# Patient Record
Sex: Male | Born: 1975 | Race: White | Hispanic: No | Marital: Married | Smoking: Never smoker
Health system: Southern US, Community
[De-identification: ages and names within clinical notes are randomized; demographics above are authoritative.]

---

## 2014-11-21 ENCOUNTER — Emergency Department (INDEPENDENT_AMBULATORY_CARE_PROVIDER_SITE_OTHER): Payer: Worker's Compensation

## 2014-11-21 ENCOUNTER — Emergency Department (INDEPENDENT_AMBULATORY_CARE_PROVIDER_SITE_OTHER)
Admission: EM | Admit: 2014-11-21 | Discharge: 2014-11-21 | Disposition: A | Discharge status: Home / Self Care | Payer: Worker's Compensation | Source: Home / Self Care | Attending: Family Medicine | Admitting: Family Medicine

## 2014-11-21 DIAGNOSIS — M25571 Pain in right ankle and joints of right foot: Secondary | ICD-10-CM | POA: Diagnosis not present

## 2014-11-21 DIAGNOSIS — S93401A Sprain of unspecified ligament of right ankle, initial encounter: Secondary | ICD-10-CM | POA: Diagnosis not present

## 2014-11-21 MED ORDER — MELOXICAM 7.5 MG PO TABS: 7.5000 mg | ORAL_TABLET | Freq: Every day | ORAL | Status: DC

## 2014-11-21 NOTE — ED Provider Notes (Signed)
CSN: [id number]     Arrival date & time 11/21/14  1303 History   First MD Initiated Contact with Patient 11/21/14 1330     Chief Complaint  Patient presents with  . Ankle Pain   (Consider location/radiation/quality/duration/timing/severity/associated sxs/prior Treatment) HPI  The patient is a 39 year old male presenting to urgent care for further evaluation of a work related incident with [employer].  Pt is c/o of right foot and ankle pain that started suddenly around 5:30-6 AM this morning after patient accidentally externally rotated his right ankle on wet grass while working.  He has continued to work this morning and early afternoon with mild to moderate discomfort but states ankle feels stable. Patient states he did fall but denies hitting his head or loss of consciousness.  Pain is in dorsal lateral aspect of right foot and ankle, 7/10 at worse with certain movements and palpation 1/10 while at rest.  He has not taking anything for pain.  Reports prior strain and sprains to foot, but no broken bones or surgeries.  Denies numbness or tingling in right foot.  Denies any other injuries.  History reviewed. No pertinent past medical history. History reviewed. No pertinent past surgical history. Family History  Problem Relation Age of Onset  . Lymphoma Father    History  Substance Use Topics  . Smoking status: Never Smoker   . Smokeless tobacco: Not on file  . Alcohol Use: 0.6 oz/week    1 Standard drinks or equivalent per week    Review of Systems  Musculoskeletal: Positive for myalgias and arthralgias. Negative for joint swelling.       Right foot and ankle  Skin: Negative for color change and wound.  Neurological: Negative for weakness and numbness.    Allergies  Review of patient's allergies indicates no known allergies.  Home Medications   Prior to Admission medications   Medication Sig Start Date End Date Taking? Authorizing Provider  propranolol (INDERAL) 20 MG tablet Take 20  mg by mouth 2 (two) times daily. Pt stated 1-2 times a day   Yes Historical Provider, MD  meloxicam (MOBIC) 7.5 MG tablet Take 1 tablet (7.5 mg total) by mouth daily. 11/21/14   [name], PA-C   BP 104/68 mmHg  Pulse 65  Temp(Src) 98.4 F (36.9 C) (Oral)  Ht 5\' 10"  (1.778 m)  Wt 166 lb 12 oz (75.637 kg)  BMI 23.93 kg/m2  SpO2 99% Physical Exam  Constitutional: He is oriented to person, place, and time. He appears well-developed and well-nourished.  HENT:  Head: Normocephalic and atraumatic.  Eyes: EOM are normal.  Neck: Normal range of motion.  Cardiovascular: Normal rate.   Pulses:      Dorsalis pedis pulses are 2+ on the right side.  Right foot: cap refill < 3 seconds  Pulmonary/Chest: Effort normal.  Musculoskeletal: Normal range of motion. He exhibits tenderness. He exhibits no edema.  Right ankle and foot: no obvious deformity or edema. Mild tenderness to dorsal lateral aspect foot and ankle. FROM at ankle and toes with increased pain on full dorsiflexion. FROM Right knee w/o pain. Calf is soft, non-tender.  Neurological: He is alert and oriented to person, place, and time.  Right foot: sensation normal. Normal gait  Skin: Skin is warm and dry. No erythema.  Right foot and ankle: skin in tact. No ecchymosis or erythema.  Psychiatric: He has a normal mood and affect. His behavior is normal.  Nursing note and vitals reviewed.   ED Course  Procedures (including critical care time) Labs Review Labs Reviewed - No data to display  Imaging Review Dg Ankle Complete Right  11/21/2014   CLINICAL DATA:  Right lateral foot and ankle pain after twisting injury at work today.  EXAM: RIGHT ANKLE - COMPLETE 3+ VIEW  COMPARISON:  None.  FINDINGS: No evidence of acute fracture or the right ankle. No focal bone lesion or bone destruction. Bone cortex and trabecular architecture appear intact. No radiopaque soft tissue foreign bodies. There is mild soft tissue thickening immediately  posterior to the ankle mortise, which may be seen with ligamentous injury.  IMPRESSION: No acute osseous abnormality of the right ankle.  Possible ligamentous injury of the right ankle.   Electronically Signed   By: Ted Mcalpine M.D.   On: 11/21/2014 14:08   Dg Foot Complete Right  11/21/2014   CLINICAL DATA:  Patient complains of right lateral foot and ankle pain after twisting injury today at work.  EXAM: RIGHT FOOT COMPLETE - 3+ VIEW  COMPARISON:  None.  FINDINGS: No evidence of acute fracture or subluxation within the right foot. No focal bone lesion or bone destruction. Bone cortex and trabecular architecture appear intact. No radiopaque soft tissue foreign bodies.  IMPRESSION: No acute osseous abnormality of the right foot.   Electronically Signed   By: Ted Mcalpine M.D.   On: 11/21/2014 14:04     MDM   1. Right ankle sprain, initial encounter   2. 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  Patient has been driving since then and states his ankle is sore but does feel stable. Patient has mild tenderness to dorsal lateral aspect of right ankle.  Full range of motion of right knee and ankle.  Normal gait.  Foot is neurovascularly intact. Plain films of right ankle: No acute osseous abnormality of right ankle; possible ligamentous injury of right ankle due to mild soft tissue thickening immediately posterior to ankle mortise.  Rx: meloxicam. Will treat patient for mild right ankle sprain by placing him in an ASO splint. Pt declined crutches. Encouraged to wear for comfort and stability.  Discussed RICE home treatment for ankle sprains and stretching exercises for home. Patient is cleared to drive (see scanned document) and continue working without restrictions, however, was encouraged to wear ASO splint as tolerated.  Encouraged to follow-up with employee health if pain not improving in 4-5  days, sooner if worsening. Patient verbalized understanding and agreement with treatment plan.    [name], PA-C 11/21/14 1429

## 2014-11-21 NOTE — ED Notes (Signed)
Work injury, Pt stated " I stepped out of truck this am between 5:30 and 6 onto the curb/grass and rolled rt ankle.  At the time the pain level was 7-8. Walking without difficulty, no swelling or bruising noted.

## 2016-04-21 ENCOUNTER — Emergency Department (INDEPENDENT_AMBULATORY_CARE_PROVIDER_SITE_OTHER)
Admission: EM | Admit: 2016-04-21 | Discharge: 2016-04-21 | Disposition: A | Discharge status: Home / Self Care | Payer: PRIVATE HEALTH INSURANCE | Source: Home / Self Care | Attending: Family Medicine | Admitting: Family Medicine

## 2016-04-21 ENCOUNTER — Emergency Department (INDEPENDENT_AMBULATORY_CARE_PROVIDER_SITE_OTHER): Payer: PRIVATE HEALTH INSURANCE

## 2016-04-21 ENCOUNTER — Encounter: Payer: Self-pay | Admitting: *Deleted

## 2016-04-21 DIAGNOSIS — R05 Cough: Secondary | ICD-10-CM

## 2016-04-21 DIAGNOSIS — R053 Chronic cough: Secondary | ICD-10-CM

## 2016-04-21 MED ORDER — AZITHROMYCIN 250 MG PO TABS: 250.0000 mg | ORAL_TABLET | Freq: Every day | ORAL | 0 refills | Status: AC

## 2016-04-21 MED ORDER — BENZONATATE 100 MG PO CAPS: 100.0000 mg | ORAL_CAPSULE | Freq: Three times a day (TID) | ORAL | 0 refills | Status: AC

## 2016-04-21 NOTE — ED Triage Notes (Signed)
Pt c/o productive cough x 1 mth; worse x 2 days. Denies fever.

## 2016-04-21 NOTE — ED Provider Notes (Signed)
CSN: [id number]     Arrival date & time 04/21/16  1230 History   First MD Initiated Contact with Patient 04/21/16 1258     Chief Complaint  Patient presents with  . Cough   (Consider location/radiation/quality/duration/timing/severity/associated sxs/prior Treatment) HPI Ryan Carney is a 41 y.o. male presenting to UC with c/o mildly productive cough that has been present for at least 1 month.  Worse over the last 2 days.  Pt notes he thought it would eventually go away but is concerned the cough is now worsening. Denies fever, chills, n/v/d. Denies chest pain or SOB.  No known sick contacts or recent travel.   History reviewed. No pertinent past medical history. History reviewed. No pertinent surgical history. Family History  Problem Relation Age of Onset  . Lymphoma Father   . Anxiety disorder Mother    Social History  Substance Use Topics  . Smoking status: Never Smoker  . Smokeless tobacco: Never Used  . Alcohol use 0.6 oz/week    1 Standard drinks or equivalent per week    Review of Systems  Constitutional: Negative for chills and fever.  HENT: Positive for congestion, postnasal drip and sore throat ( mild). Negative for sinus pain and sinus pressure.   Respiratory: Positive for cough. Negative for choking, chest tightness, shortness of breath and wheezing.   Gastrointestinal: Negative for abdominal pain, diarrhea, nausea and vomiting.  Musculoskeletal: Negative for back pain and myalgias.    Allergies  Patient has no known allergies.  Home Medications   Prior to Admission medications   Medication Sig Start Date End Date Taking? Authorizing Provider  azithromycin (ZITHROMAX) 250 MG tablet Take 1 tablet (250 mg total) by mouth daily. Take first 2 tablets together, then 1 every day until finished. 04/21/16   Junius FinnerErin O'Malley, PA-C  benzonatate (TESSALON) 100 MG capsule Take 1-2 capsules (100-200 mg total) by mouth every 8 (eight) hours. 04/21/16   Junius FinnerErin O'Malley, PA-C   propranolol (INDERAL) 20 MG tablet Take 20 mg by mouth 2 (two) times daily. Pt stated 1-2 times a day    Historical Provider, MD   Meds Ordered and Administered this Visit  Medications - No data to display  BP 112/73 (BP Location: Left Arm)   Pulse 70   Temp 98.2 F (36.8 C) (Oral)   Resp 16   Ht 5\' 10"  (1.778 m)   Wt 171 lb (77.6 kg)   SpO2 97%   BMI 24.54 kg/m  No data found.   Physical Exam  Constitutional: He is oriented to person, place, and time. He appears well-developed and well-nourished. No distress.  HENT:  Head: Normocephalic and atraumatic.  Right Ear: Tympanic membrane normal.  Left Ear: Tympanic membrane normal.  Nose: Nose normal.  Mouth/Throat: Uvula is midline, oropharynx is clear and moist and mucous membranes are normal.  Eyes: EOM are normal.  Neck: Normal range of motion. Neck supple.  Cardiovascular: Normal rate and regular rhythm.   Pulmonary/Chest: Effort normal and breath sounds normal. No stridor. No respiratory distress. He has no wheezes. He has no rales.  Musculoskeletal: Normal range of motion.  Lymphadenopathy:    He has no cervical adenopathy.  Neurological: He is alert and oriented to person, place, and time.  Skin: Skin is warm and dry. He is not diaphoretic.  Psychiatric: He has a normal mood and affect. His behavior is normal.  Nursing note and vitals reviewed.   Urgent Care Course   Clinical Course     Procedures (including  critical care time)  Labs Review Labs Reviewed - No data to display  Imaging Review Dg Chest 2 View  Result Date: 04/21/2016 CLINICAL DATA:  Cough, congestion for 1 month EXAM: CHEST  2 VIEW COMPARISON:  None. FINDINGS: No active infiltrate or effusion is seen. Mediastinal and hilar contours are unremarkable. The heart is within normal limits in size. No bony abnormality is seen. IMPRESSION: No active cardiopulmonary disease. Electronically Signed   By: Dwyane Dee M.D.   On: 04/21/2016 13:20      MDM    1. Persistent cough for 3 weeks or longer    Pt c/o persistent cough. No respiratory distress on exam.  O2 Sat 97% on RA  CXR: no evidence of acute cardiopulmonary disease. Due to duration of cough, will cover for atypical bacteria. Rx: Azithromycin and tessalon F/u with PCP in 1 week if not improving.    [name], PA-C 04/21/16 1343

## 2018-08-27 IMAGING — DX DG CHEST 2V
2 series · 2 of 2 positions shown · non-contrast
Comparison: None.

CLINICAL DATA: Cough, congestion for 1 month

EXAM:
CHEST  2 VIEW

[chest pa]
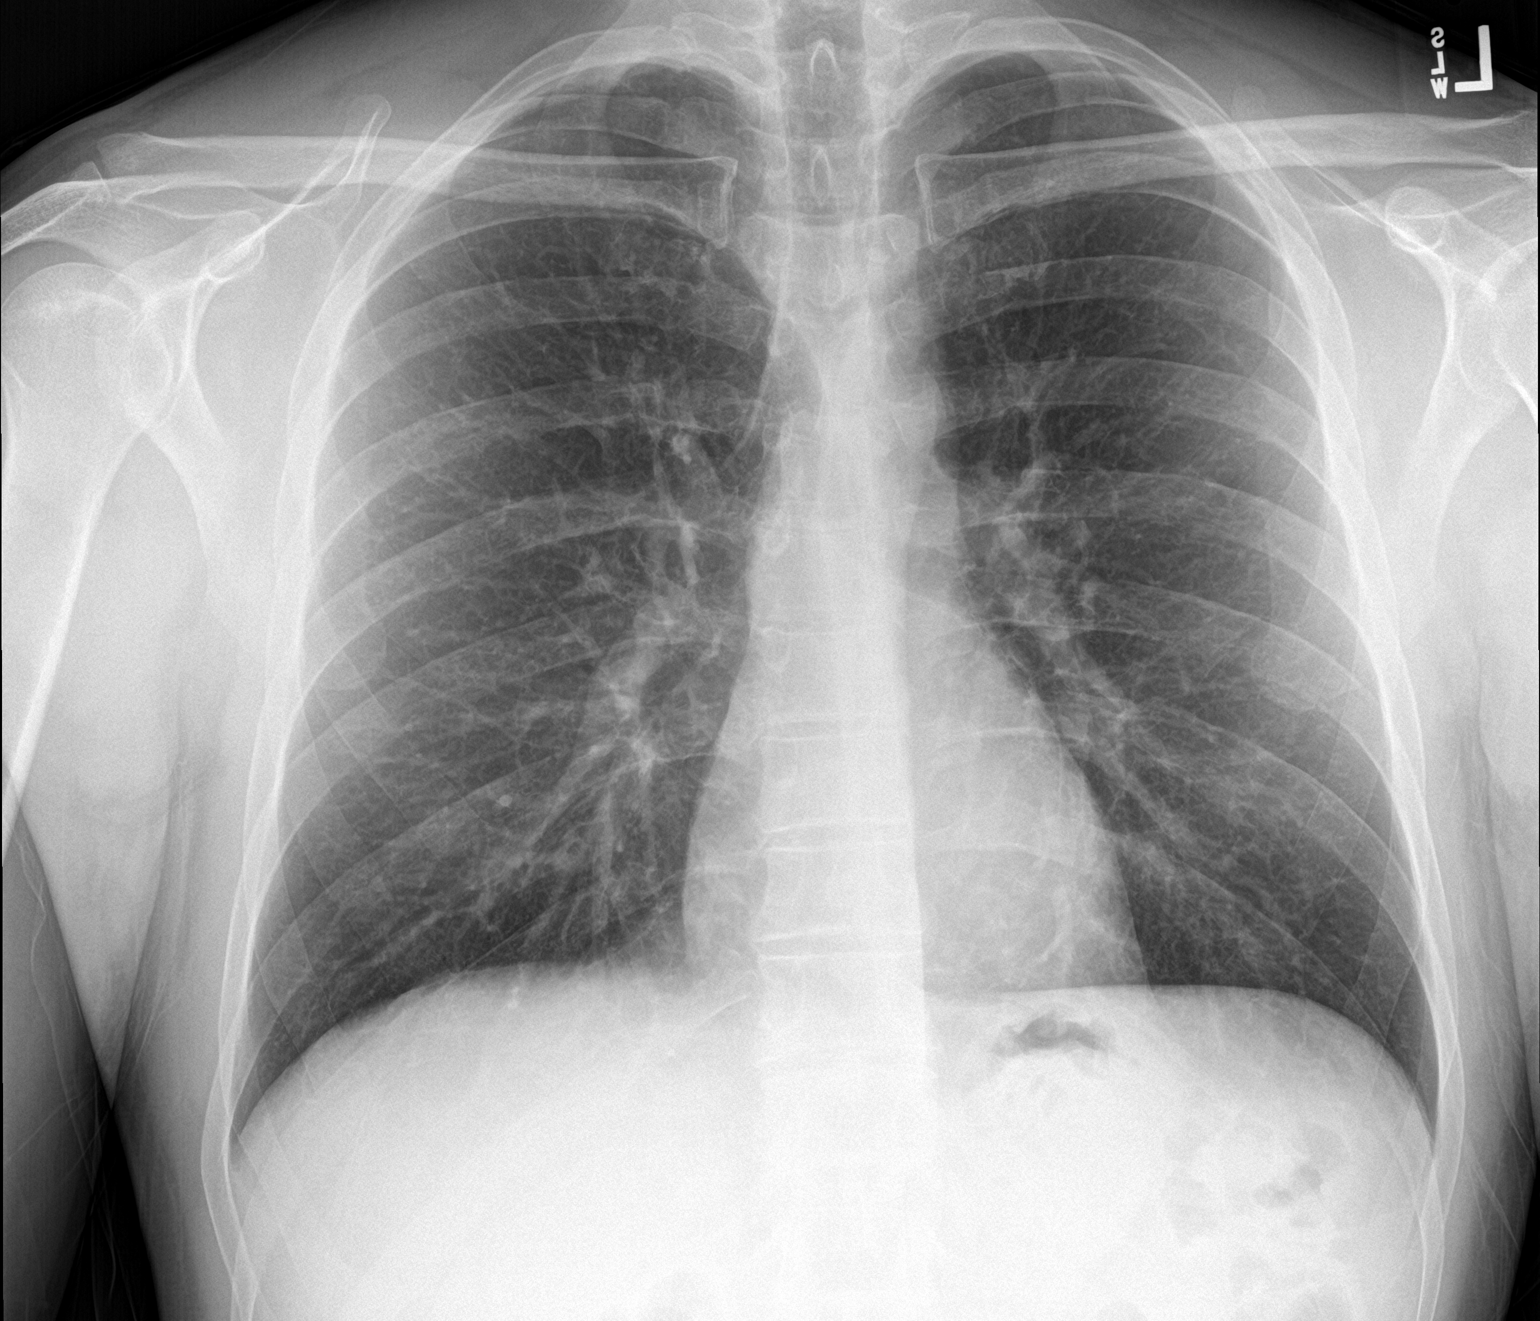

[chest lat]
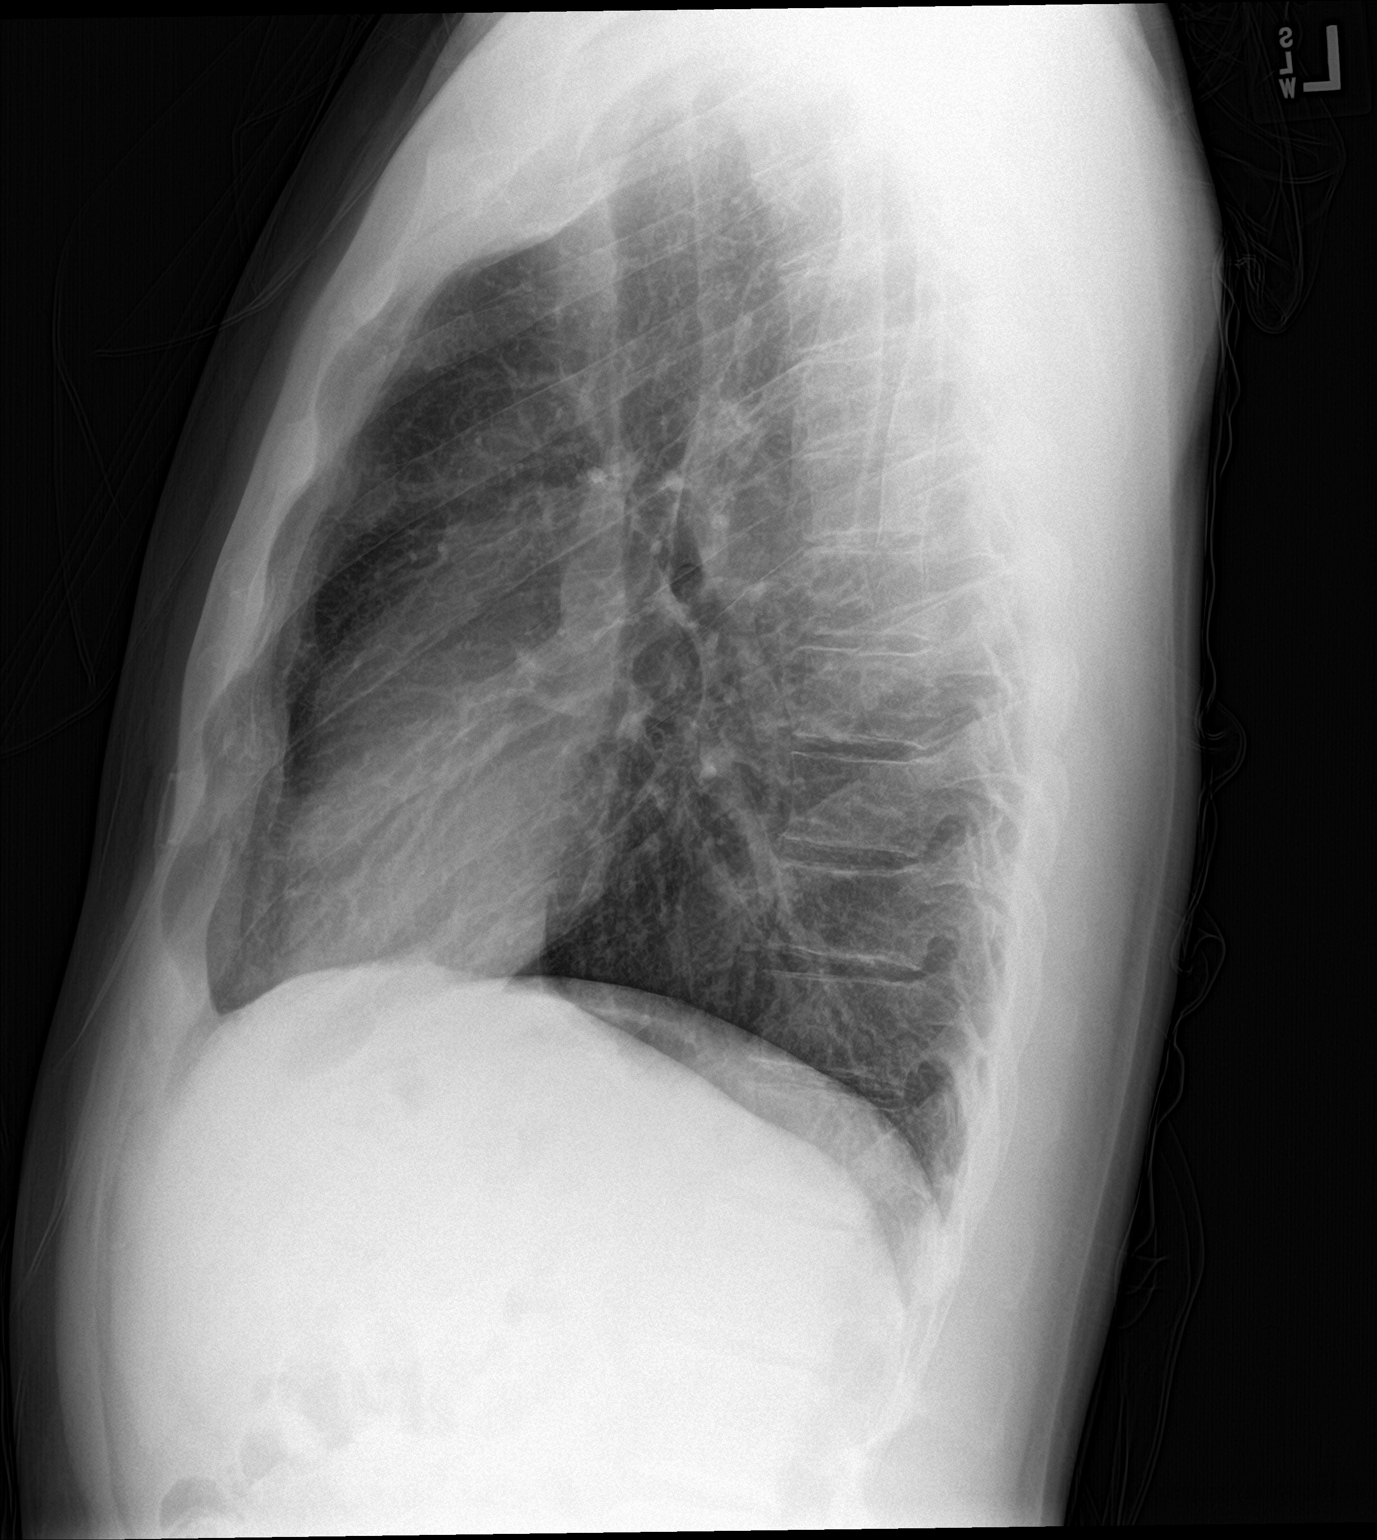

[2 of 2 positions shown; findings below may reference images not displayed]

FINDINGS: No active infiltrate or effusion is seen. Mediastinal and hilar
contours are unremarkable. The heart is within normal limits in
size. No bony abnormality is seen.
IMPRESSION: No active cardiopulmonary disease.
